# Patient Record
Sex: Male | Born: 1975 | Race: Black or African American | Hispanic: No | Marital: Married | Smoking: Current some day smoker
Health system: Southern US, Community
[De-identification: ages and names within clinical notes are randomized; demographics above are authoritative.]

## PROBLEM LIST (undated history)

## (undated) DIAGNOSIS — I10 Essential (primary) hypertension: Secondary | ICD-10-CM

## (undated) DIAGNOSIS — R7303 Prediabetes: Secondary | ICD-10-CM

## (undated) DIAGNOSIS — G4730 Sleep apnea, unspecified: Secondary | ICD-10-CM

## (undated) HISTORY — PX: NO PAST SURGERIES: SHX2092

---

## 2008-10-06 ENCOUNTER — Ambulatory Visit: Payer: Self-pay | Admitting: Internal Medicine

## 2008-11-27 ENCOUNTER — Ambulatory Visit: Payer: Self-pay | Admitting: Internal Medicine

## 2017-07-03 ENCOUNTER — Emergency Department
Admission: EM | Admit: 2017-07-03 | Discharge: 2017-07-03 | Disposition: A | Discharge status: Home / Self Care | Payer: No Typology Code available for payment source | Attending: Emergency Medicine | Admitting: Emergency Medicine

## 2017-07-03 ENCOUNTER — Emergency Department: Payer: No Typology Code available for payment source

## 2017-07-03 ENCOUNTER — Other Ambulatory Visit: Payer: Self-pay

## 2017-07-03 ENCOUNTER — Encounter: Payer: Self-pay | Admitting: Emergency Medicine

## 2017-07-03 DIAGNOSIS — I10 Essential (primary) hypertension: Secondary | ICD-10-CM | POA: Diagnosis not present

## 2017-07-03 DIAGNOSIS — S161XXA Strain of muscle, fascia and tendon at neck level, initial encounter: Secondary | ICD-10-CM | POA: Insufficient documentation

## 2017-07-03 DIAGNOSIS — F17210 Nicotine dependence, cigarettes, uncomplicated: Secondary | ICD-10-CM | POA: Diagnosis not present

## 2017-07-03 DIAGNOSIS — Y999 Unspecified external cause status: Secondary | ICD-10-CM | POA: Diagnosis not present

## 2017-07-03 DIAGNOSIS — S39012A Strain of muscle, fascia and tendon of lower back, initial encounter: Secondary | ICD-10-CM | POA: Diagnosis not present

## 2017-07-03 DIAGNOSIS — Y9389 Activity, other specified: Secondary | ICD-10-CM | POA: Insufficient documentation

## 2017-07-03 DIAGNOSIS — Y92410 Unspecified street and highway as the place of occurrence of the external cause: Secondary | ICD-10-CM | POA: Insufficient documentation

## 2017-07-03 DIAGNOSIS — S199XXA Unspecified injury of neck, initial encounter: Secondary | ICD-10-CM | POA: Diagnosis present

## 2017-07-03 HISTORY — DX: Essential (primary) hypertension: I10

## 2017-07-03 MED ORDER — MELOXICAM 15 MG PO TABS: 15.0000 mg | ORAL_TABLET | Freq: Every day | ORAL | 2 refills | Status: AC

## 2017-07-03 MED ORDER — METHOCARBAMOL 500 MG PO TABS: 500.0000 mg | ORAL_TABLET | Freq: Four times a day (QID) | ORAL | 0 refills | Status: AC

## 2017-07-03 NOTE — Discharge Instructions (Signed)
Follow-up with your regular doctor if you are not better in 3-5 days, or follow-up with Dr. Posey Pronto in a week if you feel like you need physical therapy, use ice to all the areas that hurt, apply wet heat followed by ice if you are going to stretch her muscles, use medication as prescribed, he will be sore for the next 3-5 days, this is normal, if you are worsening please return to emergency department
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
## 2017-07-06 ENCOUNTER — Other Ambulatory Visit: Payer: Self-pay | Admitting: Family Medicine

## 2017-07-06 ENCOUNTER — Ambulatory Visit: Admission: RE | Admit: 2017-07-06 | Payer: Self-pay | Source: Ambulatory Visit

## 2017-07-06 DIAGNOSIS — R079 Chest pain, unspecified: Secondary | ICD-10-CM

## 2017-08-14 ENCOUNTER — Other Ambulatory Visit: Payer: Self-pay | Admitting: General Practice

## 2017-08-14 ENCOUNTER — Ambulatory Visit
Admission: RE | Admit: 2017-08-14 | Discharge: 2017-08-14 | Disposition: A | Discharge status: Home / Self Care | Payer: Disability Insurance | Source: Ambulatory Visit | Attending: General Practice | Admitting: General Practice

## 2017-08-14 DIAGNOSIS — M5136 Other intervertebral disc degeneration, lumbar region: Secondary | ICD-10-CM | POA: Diagnosis not present

## 2017-08-14 DIAGNOSIS — S8991XA Unspecified injury of right lower leg, initial encounter: Secondary | ICD-10-CM

## 2017-08-14 DIAGNOSIS — S3992XA Unspecified injury of lower back, initial encounter: Secondary | ICD-10-CM

## 2017-08-14 DIAGNOSIS — S4991XA Unspecified injury of right shoulder and upper arm, initial encounter: Secondary | ICD-10-CM | POA: Diagnosis not present

## 2017-08-14 DIAGNOSIS — X58XXXA Exposure to other specified factors, initial encounter: Secondary | ICD-10-CM | POA: Insufficient documentation

## 2018-10-20 DIAGNOSIS — N529 Male erectile dysfunction, unspecified: Secondary | ICD-10-CM | POA: Insufficient documentation

## 2018-10-20 DIAGNOSIS — I10 Essential (primary) hypertension: Secondary | ICD-10-CM | POA: Insufficient documentation

## 2018-10-20 DIAGNOSIS — F419 Anxiety disorder, unspecified: Secondary | ICD-10-CM | POA: Insufficient documentation

## 2019-01-14 ENCOUNTER — Other Ambulatory Visit: Payer: Self-pay

## 2019-01-14 DIAGNOSIS — Z20822 Contact with and (suspected) exposure to covid-19: Secondary | ICD-10-CM

## 2019-01-17 LAB — NOVEL CORONAVIRUS, NAA: SARS-CoV-2, NAA: NOT DETECTED
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
## 2021-08-05 ENCOUNTER — Other Ambulatory Visit: Payer: Self-pay

## 2021-08-06 ENCOUNTER — Other Ambulatory Visit: Payer: Self-pay

## 2021-08-06 DIAGNOSIS — Z1211 Encounter for screening for malignant neoplasm of colon: Secondary | ICD-10-CM

## 2021-08-06 MED ORDER — PEG 3350-KCL-NA BICARB-NACL 420 G PO SOLR: 4000.0000 mL | Freq: Once | ORAL | 0 refills | Status: AC

## 2021-08-06 NOTE — Progress Notes (Signed)
Gastroenterology Pre-Procedure Review  Request Date: 08/27/2021 Requesting Physician: Dr. Vicente Males  PATIENT REVIEW QUESTIONS: The patient responded to the following health history questions as indicated:    1. Are you having any GI issues? no 2. Do you have a personal history of Polyps? no 3. Do you have a family history of Colon Cancer or Polyps? no 4. Diabetes Mellitus? no 5. Joint replacements in the past 12 months?no 6. Major health problems in the past 3 months?no 7. Any artificial heart valves, MVP, or defibrillator?no    MEDICATIONS & ALLERGIES:    Patient reports the following regarding taking any anticoagulation/antiplatelet therapy:   Plavix, Coumadin, Eliquis, Xarelto, Lovenox, Pradaxa, Brilinta, or Effient? no Aspirin? no  Patient confirms/reports the following medications:  Current Outpatient Medications  Medication Sig Dispense Refill   amLODipine (NORVASC) 5 MG tablet Take 1 tablet by mouth daily.     metFORMIN (GLUCOPHAGE) 500 MG tablet Take 500 mg by mouth daily.     sildenafil (VIAGRA) 100 MG tablet TAKE 1 TABLET(100 MG) BY MOUTH 1 TIME AS NEEDED     citalopram (CELEXA) 20 MG tablet Take 20 mg by mouth daily.     hydrochlorothiazide (HYDRODIURIL) 25 MG tablet Take 25 mg by mouth daily.     lisinopril-hydrochlorothiazide (ZESTORETIC) 20-25 MG tablet Take 1 tablet by mouth daily.     methocarbamol (ROBAXIN) 500 MG tablet Take 1 tablet (500 mg total) by mouth 4 (four) times daily. 28 tablet 0   prazosin (MINIPRESS) 1 MG capsule Take by mouth.     No current facility-administered medications for this visit.    Patient confirms/reports the following allergies:  No Known Allergies  No orders of the defined types were placed in this encounter.   AUTHORIZATION INFORMATION Primary Insurance: 1D#: Group #:  Secondary Insurance: 1D#: Group #:  SCHEDULE INFORMATION: Date: 08/27/2021 Time: Location: [place]

## 2021-08-27 ENCOUNTER — Encounter
Admission: RE | Disposition: A | Discharge status: Home / Self Care | Payer: Self-pay | Source: Home / Self Care | Attending: Gastroenterology

## 2021-08-27 ENCOUNTER — Other Ambulatory Visit: Payer: Self-pay

## 2021-08-27 ENCOUNTER — Encounter: Payer: Self-pay | Admitting: Gastroenterology

## 2021-08-27 ENCOUNTER — Ambulatory Visit
Admission: RE | Admit: 2021-08-27 | Discharge: 2021-08-27 | Disposition: A | Discharge status: Home / Self Care | Attending: Gastroenterology | Admitting: Gastroenterology

## 2021-08-27 ENCOUNTER — Ambulatory Visit: Admitting: Certified Registered Nurse Anesthetist

## 2021-08-27 DIAGNOSIS — I10 Essential (primary) hypertension: Secondary | ICD-10-CM | POA: Insufficient documentation

## 2021-08-27 DIAGNOSIS — F17290 Nicotine dependence, other tobacco product, uncomplicated: Secondary | ICD-10-CM | POA: Insufficient documentation

## 2021-08-27 DIAGNOSIS — Z7984 Long term (current) use of oral hypoglycemic drugs: Secondary | ICD-10-CM | POA: Insufficient documentation

## 2021-08-27 DIAGNOSIS — Z1211 Encounter for screening for malignant neoplasm of colon: Secondary | ICD-10-CM | POA: Diagnosis present

## 2021-08-27 DIAGNOSIS — G4730 Sleep apnea, unspecified: Secondary | ICD-10-CM | POA: Insufficient documentation

## 2021-08-27 DIAGNOSIS — R7303 Prediabetes: Secondary | ICD-10-CM | POA: Insufficient documentation

## 2021-08-27 DIAGNOSIS — Z6841 Body Mass Index (BMI) 40.0 and over, adult: Secondary | ICD-10-CM | POA: Diagnosis not present

## 2021-08-27 HISTORY — PX: COLONOSCOPY WITH PROPOFOL: SHX5780

## 2021-08-27 HISTORY — DX: Sleep apnea, unspecified: G47.30

## 2021-08-27 SURGERY — COLONOSCOPY WITH PROPOFOL
Anesthesia: General

## 2021-08-27 MED ORDER — PROPOFOL 10 MG/ML IV BOLUS
INTRAVENOUS | Status: DC | PRN
  Administered 2021-08-27: 70 mg via INTRAVENOUS

## 2021-08-27 MED ORDER — PROPOFOL 500 MG/50ML IV EMUL
INTRAVENOUS | Status: AC
  Filled 2021-08-27: qty 50

## 2021-08-27 MED ORDER — SODIUM CHLORIDE 0.9 % IV SOLN: INTRAVENOUS | Status: DC

## 2021-08-27 MED ORDER — LIDOCAINE HCL (CARDIAC) PF 100 MG/5ML IV SOSY
PREFILLED_SYRINGE | INTRAVENOUS | Status: DC | PRN
  Administered 2021-08-27: 100 mg via INTRAVENOUS

## 2021-08-27 MED ORDER — PROPOFOL 500 MG/50ML IV EMUL
INTRAVENOUS | Status: DC | PRN
  Administered 2021-08-27: 140 ug/kg/min via INTRAVENOUS

## 2021-08-27 NOTE — Anesthesia Procedure Notes (Signed)
Date/Time: 08/27/2021 8:31 AM ?Performed by: Lily Peer, Arvella Massingale, CRNA ?Pre-anesthesia Checklist: Patient identified, Emergency Drugs available, Suction available, Patient being monitored and Timeout performed ?Patient Re-evaluated:Patient Re-evaluated prior to induction ?Oxygen Delivery Method: Nasal cannula ?Induction Type: IV induction ? ? ? ? ?

## 2021-08-27 NOTE — Anesthesia Preprocedure Evaluation (Signed)
Anesthesia Evaluation  ?Patient identified by MRN, date of birth, ID band ?Patient awake ? ? ? ?Reviewed: ?Allergy & Precautions, H&P , NPO status , Patient's Chart, lab work & pertinent test results, reviewed documented beta blocker date and time  ? ?Airway ?Mallampati: II ? ?TM Distance: >3 FB ?Neck ROM: full ? ?Mouth opening: Limited Mouth Opening ? Dental ? ?(+) Dental Advidsory Given, Chipped, Teeth Intact, Missing ?  ?Pulmonary ?neg shortness of breath, sleep apnea , neg COPD, neg recent URI, Current Smoker and Patient abstained from smoking.,  ?  ?Pulmonary exam normal ?breath sounds clear to auscultation ? ? ? ? ? ? Cardiovascular ?Exercise Tolerance: Good ?hypertension, (-) angina(-) Past MI and (-) Cardiac Stents Normal cardiovascular exam(-) dysrhythmias (-) Valvular Problems/Murmurs ?Rhythm:regular Rate:Normal ? ? ?  ?Neuro/Psych ?negative neurological ROS ? negative psych ROS  ? GI/Hepatic ?negative GI ROS, Neg liver ROS,   ?Endo/Other  ?diabetes (borderline)Morbid obesity ? Renal/GU ?negative Renal ROS  ?negative genitourinary ?  ?Musculoskeletal ? ? Abdominal ?  ?Peds ? Hematology ?negative hematology ROS ?(+)   ?Anesthesia Other Findings ?Past Medical History: ?No date: Hypertension ?No date: Sleep apnea ? ? Reproductive/Obstetrics ?negative OB ROS ? ?  ? ? ? ? ? ? ? ? ? ? ? ? ? ?  ?  ? ? ? ? ? ? ? ? ?Anesthesia Physical ?Anesthesia Plan ? ?ASA: 3 ? ?Anesthesia Plan: General  ? ?Post-op Pain Management:   ? ?Induction: Intravenous ? ?PONV Risk Score and Plan: 1 and Propofol infusion and TIVA ? ?Airway Management Planned: Natural Airway and Nasal Cannula ? ?Additional Equipment:  ? ?Intra-op Plan:  ? ?Post-operative Plan:  ? ?Informed Consent: I have reviewed the patients History and Physical, chart, labs and discussed the procedure including the risks, benefits and alternatives for the proposed anesthesia with the patient or authorized representative who has  indicated his/her understanding and acceptance.  ? ? ? ?Dental Advisory Given ? ?Plan Discussed with: Anesthesiologist, CRNA and Surgeon ? ?Anesthesia Plan Comments:   ? ? ? ? ? ? ?Anesthesia Quick Evaluation ? ?

## 2021-08-27 NOTE — Op Note (Signed)
[facility] ?Gastroenterology ?Patient Name: Marc Ferrell ?Procedure Date: 08/27/2021 8:21 AM ?MRN: [id number] ?Account #: [id number] ?Date of Birth: 12/19/1975 ?Admit Type: Outpatient ?Age: 46 ?Room: [facility] ENDO ROOM 2 ?Gender: Male ?Note Status: Finalized ?Instrument Name: Colonoscope [id number] ?Procedure:             Colonoscopy ?Indications:           Screening for colorectal malignant neoplasm ?Providers:             Jonathon Bellows MD, MD ?Medicines:             Monitored Anesthesia Care ?Complications:         No immediate complications. ?Procedure:             Pre-Anesthesia Assessment: ?                       - Prior to the procedure, a History and Physical was  ?                       performed, and patient medications, allergies and  ?                       sensitivities were reviewed. The patient's tolerance  ?                       of previous anesthesia was reviewed. ?                       - The risks and benefits of the procedure and the  ?                       sedation options and risks were discussed with the  ?                       patient. All questions were answered and informed  ?                       consent was obtained. ?                       - ASA Grade Assessment: II - A patient with mild  ?                       systemic disease. ?                       After obtaining informed consent, the colonoscope was  ?                       passed under direct vision. Throughout the procedure,  ?                       the patient's blood pressure, pulse, and oxygen  ?                       saturations were monitored continuously. The  ?                       Colonoscope was introduced through the anus and  ?  advanced to the the cecum, identified by the  ?                       appendiceal orifice. The colonoscopy was performed  ?                       with ease. The patient tolerated the procedure well.  ?                       The quality of the bowel  preparation was poor. ?Findings: ?     The perianal and digital rectal examinations were normal. ?     A large amount of semi-liquid stool was found in the entire colon,  ?     interfering with visualization. ?Impression:            - Preparation of the colon was poor. ?                       - Stool in the entire examined colon. ?                       - No specimens collected. ?Recommendation:        - Discharge patient to home (with escort). ?                       - Resume previous diet. ?                       - Continue present medications. ?                       - Repeat colonoscopy in 2 weeks for screening purposes  ?                       and because the bowel preparation was suboptimal. ?Procedure Code(s):     --- Professional --- ?                       [phone], Colonoscopy, flexible; diagnostic, including  ?                       collection of specimen(s) by brushing or washing, when  ?                       performed (separate procedure) ?Diagnosis Code(s):     --- Professional --- ?                       Z12.11, Encounter for screening for malignant neoplasm  ?                       of colon ?CPT copyright 2019 [employer]. All rights reserved. ?The codes documented in this report are preliminary and upon coder review may  ?be revised to meet current compliance requirements. ?Jonathon Bellows, MD ?Jonathon Bellows MD, MD ?08/27/2021 8:45:12 AM ?This report has been signed electronically. ?Number of Addenda: 0 ?Note Initiated On: 08/27/2021 8:21 AM ?Scope Withdrawal Time: 0 hours 4 minutes 26 seconds  ?Total Procedure Duration: 0 hours 6 minutes 46 seconds  ?Estimated Blood Loss:  Estimated blood loss: none. ?  [facility] ?

## 2021-08-27 NOTE — Transfer of Care (Signed)
Immediate Anesthesia Transfer of Care Note ? ?Patient: Marc Ferrell ? ?Procedure(s) Performed: COLONOSCOPY WITH PROPOFOL ? ?Patient Location: Endoscopy Unit ? ?Anesthesia Type:General ? ?Level of Consciousness: awake and alert  ? ?Airway & Oxygen Therapy: Patient Spontanous Breathing ? ?Post-op Assessment: Report given to RN and Post -op Vital signs reviewed and stable ? ?Post vital signs: Reviewed and stable ? ?Last Vitals:  ?Vitals Value Taken Time  ?BP 124/72   ?Temp    ?Pulse 84 08/27/21 0844  ?Resp 22 08/27/21 0844  ?SpO2 100 % 08/27/21 0844  ?Vitals shown include unvalidated device data. ? ?Last Pain:  ?Vitals:  ? 08/27/21 0805  ?TempSrc: Temporal  ?PainSc: 0-No pain  ?   ? ?  ? ?Complications: No notable events documented. ?

## 2021-08-27 NOTE — H&P (Signed)
? ? ? ?Jonathon Bellows, MD ?[address] [place], [place], [place], [address] ?[address], [place], [place], [place], [address] ?Phone: [phone]  ?Fax: [phone] ? ?Primary Care Physician:  System, Provider Not In ? ? ?Pre-Procedure History & Physical: ?HPI:  Marc Ferrell is a 46 y.o. male is here for an colonoscopy. ?  ?Past Medical History:  ?Diagnosis Date  ? Hypertension   ? Sleep apnea   ? ? ?Past Surgical History:  ?Procedure Laterality Date  ? NO PAST SURGERIES    ? ? ?Prior to Admission medications   ?Medication Sig Start Date End Date Taking? Authorizing Provider  ?amLODipine (NORVASC) 5 MG tablet Take 1 tablet by mouth daily. 12/14/19  Yes [provider]  ?citalopram (CELEXA) 20 MG tablet Take 20 mg by mouth daily. 04/10/21  Yes [provider]  ?lisinopril-hydrochlorothiazide (ZESTORETIC) 20-25 MG tablet Take 1 tablet by mouth daily. 07/31/21  Yes [provider]  ?metFORMIN (GLUCOPHAGE) 500 MG tablet Take 500 mg by mouth daily. 06/03/21  Yes [provider]  ?methocarbamol (ROBAXIN) 500 MG tablet Take 1 tablet (500 mg total) by mouth 4 (four) times daily. 07/03/17  Yes Fisher, Linden Dolin, PA-C  ?hydrochlorothiazide (HYDRODIURIL) 25 MG tablet Take 25 mg by mouth daily. ?Patient not taking: Reported on 08/27/2021    [provider]  ?prazosin (MINIPRESS) 1 MG capsule Take by mouth. 04/10/21   [provider]  ?sildenafil (VIAGRA) 100 MG tablet TAKE 1 TABLET(100 MG) BY MOUTH 1 TIME AS NEEDED 01/02/21   [provider]  ? ? ?Allergies as of 08/06/2021  ? (No Known Allergies)  ? ? ?History reviewed. No pertinent family history. ? ?Social History  ? ?Socioeconomic History  ? Marital status: Married  ?  Spouse name: Not on file  ? Number of children: Not on file  ? Years of education: Not on file  ? Highest education level: Not on file  ?Occupational History  ? Not on file  ?Tobacco Use  ? Smoking status: Some Days  ?  Types: Cigars  ?  Smokeless tobacco: Never  ?Vaping Use  ? Vaping Use: Never used  ?Substance and Sexual Activity  ? Alcohol use: Yes  ?  Comment: occasional  ? Drug use: Never  ? Sexual activity: Not on file  ?Other Topics Concern  ? Not on file  ?Social History Narrative  ? Not on file  ? ?Social Determinants of Health  ? ?Financial Resource Strain: Not on file  ?Food Insecurity: Not on file  ?Transportation Needs: Not on file  ?Physical Activity: Not on file  ?Stress: Not on file  ?Social Connections: Not on file  ?Intimate Partner Violence: Not on file  ? ? ?Review of Systems: ?See HPI, otherwise negative ROS ? ?Physical Exam: ?BP (!) 149/95   Pulse 73   Temp (!) 97 ?F (36.1 ?C) (Temporal)   Resp 20   Ht '5\' 7"'$  (1.702 m)   Wt 118.8 kg   SpO2 100%   BMI 41.04 kg/m?  ?General:   Alert,  pleasant and cooperative in NAD ?Head:  Normocephalic and atraumatic. ?Neck:  Supple; no masses or thyromegaly. ?Lungs:  Clear throughout to auscultation, normal respiratory effort.    ?Heart:  +S1, +S2, Regular rate and rhythm, No edema. ?Abdomen:  Soft, nontender and nondistended. Normal bowel sounds, without guarding, and without rebound.   ?Neurologic:  Alert and  oriented x4;  grossly normal neurologically. ? ?Impression/Plan: ?Marc Ferrell is here for  an colonoscopy to be performed for Screening colonoscopy average risk   ?Risks, benefits, limitations, and alternatives regarding  colonoscopy have been reviewed with the patient.  Questions have been answered.  All parties agreeable. ? ? ?Jonathon Bellows, MD  08/27/2021, 8:26 AM ? ?

## 2021-08-27 NOTE — Anesthesia Postprocedure Evaluation (Signed)
Anesthesia Post Note ? ?Patient: Marc Ferrell ? ?Procedure(s) Performed: COLONOSCOPY WITH PROPOFOL ? ?Patient location during evaluation: Endoscopy ?Anesthesia Type: General ?Level of consciousness: awake and alert ?Pain management: pain level controlled ?Vital Signs Assessment: post-procedure vital signs reviewed and stable ?Respiratory status: spontaneous breathing, nonlabored ventilation, respiratory function stable and patient connected to nasal cannula oxygen ?Cardiovascular status: blood pressure returned to baseline and stable ?Postop Assessment: no apparent nausea or vomiting ?Anesthetic complications: no ? ? ?No notable events documented. ? ? ?Last Vitals:  ?Vitals:  ? 08/27/21 0854 08/27/21 0855  ?BP:  120/70  ?Pulse:    ?Resp:    ?Temp: (!) 35.9 ?C   ?SpO2:    ?  ?Last Pain:  ?Vitals:  ? 08/27/21 0902  ?TempSrc:   ?PainSc: 0-No pain  ? ? ?  ?  ?  ?  ?  ?  ? ?Martha Clan ? ? ? ? ?

## 2021-08-28 ENCOUNTER — Encounter: Payer: Self-pay | Admitting: Gastroenterology

## 2022-02-19 ENCOUNTER — Other Ambulatory Visit: Payer: Self-pay

## 2022-02-19 ENCOUNTER — Telehealth: Payer: Self-pay

## 2022-02-19 DIAGNOSIS — Z1211 Encounter for screening for malignant neoplasm of colon: Secondary | ICD-10-CM

## 2022-02-19 MED ORDER — GOLYTELY 236 G PO SOLR: 8000.0000 mL | Freq: Once | ORAL | 0 refills | Status: AC

## 2022-02-19 NOTE — Telephone Encounter (Signed)
Patients call has been returned to reschedule his colonoscopy with a a 2 day prep.  Colonoscopy has been scheduled to 03/25/22 with Dr. Vicente Males.

## 2022-03-24 ENCOUNTER — Encounter: Payer: Self-pay | Admitting: Gastroenterology

## 2022-03-25 ENCOUNTER — Ambulatory Visit: Admitting: Anesthesiology

## 2022-03-25 ENCOUNTER — Encounter: Payer: Self-pay | Admitting: Gastroenterology

## 2022-03-25 ENCOUNTER — Encounter
Admission: RE | Disposition: A | Discharge status: Home / Self Care | Payer: Self-pay | Source: Home / Self Care | Attending: Gastroenterology

## 2022-03-25 ENCOUNTER — Ambulatory Visit
Admission: RE | Admit: 2022-03-25 | Discharge: 2022-03-25 | Disposition: A | Discharge status: Home / Self Care | Attending: Gastroenterology | Admitting: Gastroenterology

## 2022-03-25 DIAGNOSIS — D128 Benign neoplasm of rectum: Secondary | ICD-10-CM | POA: Insufficient documentation

## 2022-03-25 DIAGNOSIS — F17290 Nicotine dependence, other tobacco product, uncomplicated: Secondary | ICD-10-CM | POA: Diagnosis not present

## 2022-03-25 DIAGNOSIS — I10 Essential (primary) hypertension: Secondary | ICD-10-CM | POA: Insufficient documentation

## 2022-03-25 DIAGNOSIS — R7303 Prediabetes: Secondary | ICD-10-CM | POA: Diagnosis not present

## 2022-03-25 DIAGNOSIS — D126 Benign neoplasm of colon, unspecified: Secondary | ICD-10-CM

## 2022-03-25 DIAGNOSIS — G4730 Sleep apnea, unspecified: Secondary | ICD-10-CM | POA: Diagnosis not present

## 2022-03-25 DIAGNOSIS — Z1211 Encounter for screening for malignant neoplasm of colon: Secondary | ICD-10-CM | POA: Insufficient documentation

## 2022-03-25 HISTORY — DX: Prediabetes: R73.03

## 2022-03-25 HISTORY — PX: COLONOSCOPY WITH PROPOFOL: SHX5780

## 2022-03-25 SURGERY — COLONOSCOPY WITH PROPOFOL
Anesthesia: General

## 2022-03-25 MED ORDER — PROPOFOL 10 MG/ML IV BOLUS
INTRAVENOUS | Status: DC | PRN
  Administered 2022-03-25: 150 ug/kg/min via INTRAVENOUS
  Administered 2022-03-25: 70 mg via INTRAVENOUS

## 2022-03-25 MED ORDER — SODIUM CHLORIDE 0.9 % IV SOLN: INTRAVENOUS | Status: DC

## 2022-03-25 MED ORDER — LIDOCAINE HCL (CARDIAC) PF 100 MG/5ML IV SOSY
PREFILLED_SYRINGE | INTRAVENOUS | Status: DC | PRN
  Administered 2022-03-25: 60 mg via INTRAVENOUS

## 2022-03-25 NOTE — Anesthesia Postprocedure Evaluation (Signed)
Anesthesia Post Note  Patient: Marc Ferrell  Procedure(s) Performed: COLONOSCOPY WITH PROPOFOL  Patient location during evaluation: Endoscopy Anesthesia Type: General Level of consciousness: awake and alert Pain management: pain level controlled Vital Signs Assessment: post-procedure vital signs reviewed and stable Respiratory status: spontaneous breathing, nonlabored ventilation, respiratory function stable and patient connected to nasal cannula oxygen Cardiovascular status: blood pressure returned to baseline and stable Postop Assessment: no apparent nausea or vomiting Anesthetic complications: no   No notable events documented.   Last Vitals:  Vitals:   03/25/22 0922 03/25/22 0932  BP:  (!) 170/90  Pulse: 82   Resp:    Temp:    SpO2: 100%     Last Pain:  Vitals:   03/25/22 0932  TempSrc:   PainSc: 0-No pain                 Precious Haws Jsiah Menta

## 2022-03-25 NOTE — H&P (Signed)
Marc Bellows, MD [address], [place], [place], [place], [address] [address], [place], [place], [place], [address] Phone: [phone]  Fax: [phone]  Primary Care Physician:  System, Provider Not In   Pre-Procedure History & Physical: HPI:  Marc Ferrell is a 46 y.o. male is here for an colonoscopy.   Past Medical History:  Diagnosis Date   Hypertension    Pre-diabetes    Sleep apnea     Past Surgical History:  Procedure Laterality Date   COLONOSCOPY WITH PROPOFOL N/A 08/27/2021   Procedure: COLONOSCOPY WITH PROPOFOL;  Surgeon: Marc Bellows, MD;  Location: [facility] ENDOSCOPY;  Service: Gastroenterology;  Laterality: N/A;   NO PAST SURGERIES      Prior to Admission medications   Medication Sig Start Date End Date Taking? Authorizing Provider  amLODipine (NORVASC) 5 MG tablet Take 1 tablet by mouth daily. 12/14/19  Yes [provider]  citalopram (CELEXA) 20 MG tablet Take 20 mg by mouth daily. 04/10/21  Yes [provider]  lisinopril-hydrochlorothiazide (ZESTORETIC) 20-25 MG tablet Take 1 tablet by mouth daily. 07/31/21  Yes [provider]  metFORMIN (GLUCOPHAGE) 500 MG tablet Take 500 mg by mouth daily. 06/03/21  Yes [provider]  prazosin (MINIPRESS) 1 MG capsule Take by mouth. 04/10/21  Yes [provider]  hydrochlorothiazide (HYDRODIURIL) 25 MG tablet Take 25 mg by mouth daily. Patient not taking: Reported on 08/27/2021    [provider]  methocarbamol (ROBAXIN) 500 MG tablet Take 1 tablet (500 mg total) by mouth 4 (four) times daily. 07/03/17   Fisher, Linden Dolin, PA-C  sildenafil (VIAGRA) 100 MG tablet TAKE 1 TABLET(100 MG) BY MOUTH 1 TIME AS NEEDED 01/02/21   [provider]    Allergies as of 02/19/2022   (No Known Allergies)    History reviewed. No pertinent family history.  Social History   Socioeconomic History   Marital status: Married    Spouse name: Not on file   Number of  children: Not on file   Years of education: Not on file   Highest education level: Not on file  Occupational History   Not on file  Tobacco Use   Smoking status: Some Days    Types: Cigars   Smokeless tobacco: Never  Vaping Use   Vaping Use: Never used  Substance and Sexual Activity   Alcohol use: Yes    Comment: occasional   Drug use: Never   Sexual activity: Not on file  Other Topics Concern   Not on file  Social History Narrative   Not on file   Social Determinants of Health   Financial Resource Strain: Not on file  Food Insecurity: Not on file  Transportation Needs: Not on file  Physical Activity: Not on file  Stress: Not on file  Social Connections: Not on file  Intimate Partner Violence: Not on file    Review of Systems: See HPI, otherwise negative ROS  Physical Exam: BP (!) 182/110   Pulse 73   Temp (!) 97.1 F (36.2 C) (Temporal)   Resp 18   Ht '5\' 7"'$  (1.702 m)   Wt 118.8 kg   SpO2 99%   BMI 41.04 kg/m  General:   Alert,  pleasant and cooperative in NAD Head:  Normocephalic and atraumatic. Neck:  Supple; no masses or thyromegaly. Lungs:  Clear throughout to auscultation, normal respiratory effort.    Heart:  +S1, +S2, Regular rate and rhythm, No edema. Abdomen:  Soft,  nontender and nondistended. Normal bowel sounds, without guarding, and without rebound.   Neurologic:  Alert and  oriented x4;  grossly normal neurologically.  Impression/Plan: Marc Ferrell is here for an colonoscopy to be performed for Screening colonoscopy average risk   Risks, benefits, limitations, and alternatives regarding  colonoscopy have been reviewed with the patient.  Questions have been answered.  All parties agreeable.   Marc Bellows, MD  03/25/2022, 9:09 AM

## 2022-03-25 NOTE — Op Note (Signed)
[facility] Gastroenterology Patient Name: Marc Ferrell Procedure Date: 03/25/2022 8:47 AM MRN: [id number] Account #: [id number] Date of Birth: 06-May-1976 Admit Type: Outpatient Age: 46 Room: [facility] ENDO ROOM 2 Gender: Male Note Status: Finalized Instrument Name: Jasper Riling [id number] Procedure:             Colonoscopy Indications:           Screening for colorectal malignant neoplasm Providers:             Jonathon Bellows MD, MD Referring MD:          No Local Md, MD (Referring MD) Medicines:             Monitored Anesthesia Care Complications:         No immediate complications. Procedure:             Pre-Anesthesia Assessment:                        - Prior to the procedure, a History and Physical was                         performed, and patient medications, allergies and                         sensitivities were reviewed. The patient's tolerance                         of previous anesthesia was reviewed.                        - The risks and benefits of the procedure and the                         sedation options and risks were discussed with the                         patient. All questions were answered and informed                         consent was obtained.                        - ASA Grade Assessment: II - A patient with mild                         systemic disease.                        After obtaining informed consent, the colonoscope was                         passed under direct vision. Throughout the procedure,                         the patient's blood pressure, pulse, and oxygen                         saturations were monitored continuously. The                         Colonoscope was  introduced through the anus and                         advanced to the the cecum, identified by the                         appendiceal orifice. The colonoscopy was performed                         with ease. The patient tolerated the procedure well.                          The quality of the bowel preparation was excellent. Findings:      The perianal and digital rectal examinations were normal.      A 5 mm polyp was found in the rectum. The polyp was sessile. The polyp       was removed with a cold snare. Resection and retrieval were complete.      The exam was otherwise without abnormality on direct and retroflexion       views. Impression:            - One 5 mm polyp in the rectum, removed with a cold                         snare. Resected and retrieved.                        - The examination was otherwise normal on direct and                         retroflexion views. Recommendation:        - Discharge patient to home (with escort).                        - Resume previous diet.                        - Continue present medications.                        - Await pathology results.                        - Repeat colonoscopy for surveillance based on                         pathology results. Procedure Code(s):     --- Professional ---                        [id number], Colonoscopy, flexible; with removal of                         tumor(s), polyp(s), or other lesion(s) by snare                         technique Diagnosis Code(s):     --- Professional ---                        Z12.11, Encounter for screening for malignant neoplasm  of colon                        K62.1, Rectal polyp CPT copyright 2019 [employer] Association. All rights reserved. The codes documented in this report are preliminary and upon coder review may  be revised to meet current compliance requirements. Jonathon Bellows, MD Jonathon Bellows MD, MD 03/25/2022 9:06:53 AM This report has been signed electronically. Number of Addenda: 0 Note Initiated On: 03/25/2022 8:47 AM Scope Withdrawal Time: 0 hours 9 minutes 6 seconds  Total Procedure Duration: 0 hours 11 minutes 58 seconds  Estimated Blood Loss:  Estimated blood loss: none.       [facility]

## 2022-03-25 NOTE — H&P (Signed)
Jonathon Bellows, MD [address] [place], [place], [place], [address] [address], [place], [place], [place], [address] Phone: [phone]  Fax: [phone]  Primary Care Physician:  System, Provider Not In   Pre-Procedure History & Physical: HPI:  Marc Ferrell is a 46 y.o. male is here for an colonoscopy.   Past Medical History:  Diagnosis Date   Hypertension    Pre-diabetes    Sleep apnea     Past Surgical History:  Procedure Laterality Date   COLONOSCOPY WITH PROPOFOL N/A 08/27/2021   Procedure: COLONOSCOPY WITH PROPOFOL;  Surgeon: Jonathon Bellows, MD;  Location: [facility] ENDOSCOPY;  Service: Gastroenterology;  Laterality: N/A;   NO PAST SURGERIES      Prior to Admission medications   Medication Sig Start Date End Date Taking? Authorizing Provider  amLODipine (NORVASC) 5 MG tablet Take 1 tablet by mouth daily. 12/14/19  Yes [provider]  citalopram (CELEXA) 20 MG tablet Take 20 mg by mouth daily. 04/10/21  Yes [provider]  lisinopril-hydrochlorothiazide (ZESTORETIC) 20-25 MG tablet Take 1 tablet by mouth daily. 07/31/21  Yes [provider]  metFORMIN (GLUCOPHAGE) 500 MG tablet Take 500 mg by mouth daily. 06/03/21  Yes [provider]  prazosin (MINIPRESS) 1 MG capsule Take by mouth. 04/10/21  Yes [provider]  hydrochlorothiazide (HYDRODIURIL) 25 MG tablet Take 25 mg by mouth daily. Patient not taking: Reported on 08/27/2021    [provider]  methocarbamol (ROBAXIN) 500 MG tablet Take 1 tablet (500 mg total) by mouth 4 (four) times daily. 07/03/17   Fisher, Linden Dolin, PA-C  sildenafil (VIAGRA) 100 MG tablet TAKE 1 TABLET(100 MG) BY MOUTH 1 TIME AS NEEDED 01/02/21   [provider]    Allergies as of 02/19/2022   (No Known Allergies)    History reviewed. No pertinent family history.  Social History   Socioeconomic History   Marital status: Married    Spouse name: Not on file   Number of  children: Not on file   Years of education: Not on file   Highest education level: Not on file  Occupational History   Not on file  Tobacco Use   Smoking status: Some Days    Types: Cigars   Smokeless tobacco: Never  Vaping Use   Vaping Use: Never used  Substance and Sexual Activity   Alcohol use: Yes    Comment: occasional   Drug use: Never   Sexual activity: Not on file  Other Topics Concern   Not on file  Social History Narrative   Not on file   Social Determinants of Health   Financial Resource Strain: Not on file  Food Insecurity: Not on file  Transportation Needs: Not on file  Physical Activity: Not on file  Stress: Not on file  Social Connections: Not on file  Intimate Partner Violence: Not on file    Review of Systems: See HPI, otherwise negative ROS  Physical Exam: BP (!) 182/110   Pulse 73   Temp (!) 97.1 F (36.2 C) (Temporal)   Resp 18   Ht '5\' 7"'$  (1.702 m)   Wt 118.8 kg   SpO2 99%   BMI 41.04 kg/m  General:   Alert,  pleasant and cooperative in NAD Head:  Normocephalic and atraumatic. Neck:  Supple; no masses or thyromegaly. Lungs:  Clear throughout to auscultation, normal respiratory effort.    Heart:  +S1, +S2, Regular rate and rhythm, No edema. Abdomen:  Soft,  nontender and nondistended. Normal bowel sounds, without guarding, and without rebound.   Neurologic:  Alert and  oriented x4;  grossly normal neurologically.  Impression/Plan: Marc Ferrell is here for an colonoscopy to be performed for Screening colonoscopy average risk   Risks, benefits, limitations, and alternatives regarding  colonoscopy have been reviewed with the patient.  Questions have been answered.  All parties agreeable.   Jonathon Bellows, MD  03/25/2022, 8:36 AM

## 2022-03-25 NOTE — Anesthesia Preprocedure Evaluation (Signed)
Anesthesia Evaluation  Patient identified by MRN, date of birth, ID band Patient awake    Reviewed: Allergy & Precautions, NPO status , Patient's Chart, lab work & pertinent test results  History of Anesthesia Complications Negative for: history of anesthetic complications  Airway Mallampati: III  TM Distance: <3 FB Neck ROM: full    Dental  (+) Chipped   Pulmonary neg shortness of breath, sleep apnea , Current Smoker and Patient abstained from smoking.,    Pulmonary exam normal        Cardiovascular Exercise Tolerance: Good hypertension, (-) anginaNormal cardiovascular exam     Neuro/Psych PSYCHIATRIC DISORDERS negative neurological ROS     GI/Hepatic negative GI ROS, Neg liver ROS, neg GERD  ,  Endo/Other  negative endocrine ROS  Renal/GU negative Renal ROS  negative genitourinary   Musculoskeletal   Abdominal   Peds  Hematology negative hematology ROS (+)   Anesthesia Other Findings Past Medical History: No date: Hypertension No date: Pre-diabetes No date: Sleep apnea  Past Surgical History: 08/27/2021: COLONOSCOPY WITH PROPOFOL; N/A     Comment:  Procedure: COLONOSCOPY WITH PROPOFOL;  Surgeon: Jonathon Bellows, MD;  Location: [facility] ENDOSCOPY;  Service:               Gastroenterology;  Laterality: N/A; No date: NO PAST SURGERIES  BMI    Body Mass Index: 41.04 kg/m      Reproductive/Obstetrics negative OB ROS                             Anesthesia Physical Anesthesia Plan  ASA: 3  Anesthesia Plan: General   Post-op Pain Management:    Induction: Intravenous  PONV Risk Score and Plan: Propofol infusion and TIVA  Airway Management Planned: Natural Airway and Nasal Cannula  Additional Equipment:   Intra-op Plan:   Post-operative Plan:   Informed Consent: I have reviewed the patients History and Physical, chart, labs and discussed the procedure including  the risks, benefits and alternatives for the proposed anesthesia with the patient or authorized representative who has indicated his/her understanding and acceptance.     Dental Advisory Given  Plan Discussed with: Anesthesiologist, CRNA and Surgeon  Anesthesia Plan Comments: (Patient consented for risks of anesthesia including but not limited to:  - adverse reactions to medications - risk of airway placement if required - damage to eyes, teeth, lips or other oral mucosa - nerve damage due to positioning  - sore throat or hoarseness - Damage to heart, brain, nerves, lungs, other parts of body or loss of life  Patient voiced understanding.)        Anesthesia Quick Evaluation

## 2022-03-25 NOTE — Transfer of Care (Signed)
Immediate Anesthesia Transfer of Care Note  Patient: Marc Ferrell  Procedure(s) Performed: COLONOSCOPY WITH PROPOFOL  Patient Location: Endoscopy Unit  Anesthesia Type:General  Level of Consciousness: awake, alert  and oriented  Airway & Oxygen Therapy: Patient Spontanous Breathing and Patient connected to nasal cannula oxygen  Post-op Assessment: Report given to RN, Post -op Vital signs reviewed and stable and Patient moving all extremities  Post vital signs: Reviewed and stable  Last Vitals:  Vitals Value Taken Time  BP 157/69 03/25/22 0911  Temp 36.9 C 03/25/22 0912  Pulse 79 03/25/22 0912  Resp 19 03/25/22 0912  SpO2 100 % 03/25/22 0912  Vitals shown include unvalidated device data.  Last Pain:  Vitals:   03/25/22 0912  TempSrc: Temporal  PainSc: 0-No pain         Complications: No notable events documented.

## 2022-03-26 ENCOUNTER — Encounter: Payer: Self-pay | Admitting: Gastroenterology

## 2022-03-26 LAB — SURGICAL PATHOLOGY

## 2022-04-02 ENCOUNTER — Encounter: Payer: Self-pay | Admitting: Gastroenterology
